# Patient Record
Sex: Male | Born: 1975 | Race: White | Hispanic: No | Marital: Married | Smoking: Never smoker
Health system: Southern US, Community
[De-identification: ages and names within clinical notes are randomized; demographics above are authoritative.]

---

## 2010-07-21 ENCOUNTER — Emergency Department: Payer: Self-pay | Admitting: Internal Medicine

## 2011-04-23 ENCOUNTER — Emergency Department: Payer: Self-pay | Admitting: Emergency Medicine

## 2011-04-23 LAB — CBC
MCH: 30.8 pg (ref 26.0–34.0)
MCHC: 34.2 g/dL (ref 32.0–36.0)
Platelet: 234 10*3/uL (ref 150–440)
RBC: 5.01 10*6/uL (ref 4.40–5.90)
RDW: 12.9 % (ref 11.5–14.5)

## 2011-04-23 LAB — COMPREHENSIVE METABOLIC PANEL
Alkaline Phosphatase: 45 U/L — ABNORMAL LOW (ref 50–136)
Anion Gap: 14 (ref 7–16)
BUN: 15 mg/dL (ref 7–18)
Calcium, Total: 8.9 mg/dL (ref 8.5–10.1)
Chloride: 108 mmol/L — ABNORMAL HIGH (ref 98–107)
Creatinine: 0.85 mg/dL (ref 0.60–1.30)
EGFR (African American): >60
EGFR (Non-African Amer.): >60
Glucose: 110 mg/dL — ABNORMAL HIGH (ref 65–99)
Osmolality: 288 (ref 275–301)
SGPT (ALT): 44 U/L

## 2011-04-23 LAB — TSH: Thyroid Stimulating Horm: 3.02 u[IU]/mL

## 2011-04-23 LAB — ETHANOL: Ethanol: 215 mg/dL

## 2011-06-07 ENCOUNTER — Emergency Department: Payer: Self-pay | Admitting: Internal Medicine

## 2011-06-07 LAB — COMPREHENSIVE METABOLIC PANEL
Anion Gap: 15 (ref 7–16)
Bilirubin,Total: 0.8 mg/dL (ref 0.2–1.0)
Calcium, Total: 9.4 mg/dL (ref 8.5–10.1)
Creatinine: 0.92 mg/dL (ref 0.60–1.30)
EGFR (African American): >60
EGFR (Non-African Amer.): >60
Glucose: 103 mg/dL — ABNORMAL HIGH (ref 65–99)
Osmolality: 269 (ref 275–301)
Potassium: 3.2 mmol/L — ABNORMAL LOW (ref 3.5–5.1)
SGOT(AST): 31 U/L (ref 15–37)
Sodium: 135 mmol/L — ABNORMAL LOW (ref 136–145)
Total Protein: 8.4 g/dL — ABNORMAL HIGH (ref 6.4–8.2)

## 2011-06-07 LAB — URINALYSIS, COMPLETE
Bilirubin,UR: NEGATIVE
Blood: NEGATIVE
Leukocyte Esterase: NEGATIVE
Ph: 6 (ref 4.5–8.0)
Protein: NEGATIVE
RBC,UR: 1 /HPF (ref 0–5)
Specific Gravity: 1.019 (ref 1.003–1.030)

## 2011-06-07 LAB — DRUG SCREEN, URINE
Benzodiazepine, Ur Scrn: NEGATIVE (ref ?–200)
Cocaine Metabolite,Ur ~~LOC~~: NEGATIVE (ref ?–300)
Methadone, Ur Screen: NEGATIVE (ref ?–300)
Opiate, Ur Screen: NEGATIVE (ref ?–300)
Phencyclidine (PCP) Ur S: NEGATIVE (ref ?–25)
Tricyclic, Ur Screen: NEGATIVE (ref ?–1000)

## 2011-06-07 LAB — ETHANOL: Ethanol: <3 mg/dL

## 2011-06-07 LAB — ACETAMINOPHEN LEVEL: Acetaminophen: <2 ug/mL

## 2011-06-14 ENCOUNTER — Emergency Department: Payer: Self-pay | Admitting: Emergency Medicine

## 2011-06-15 LAB — ETHANOL: Ethanol %: 0.12 % — ABNORMAL HIGH (ref 0.000–0.080)

## 2011-06-15 LAB — URINALYSIS, COMPLETE
Bilirubin,UR: NEGATIVE
Glucose,UR: NEGATIVE mg/dL (ref 0–75)
Leukocyte Esterase: NEGATIVE
RBC,UR: <1 /HPF (ref 0–5)
Specific Gravity: 1.024 (ref 1.003–1.030)
Squamous Epithelial: <1
WBC UR: <1 /HPF (ref 0–5)

## 2011-06-15 LAB — CBC
HGB: 16.9 g/dL (ref 13.0–18.0)
MCH: 32.4 pg (ref 26.0–34.0)
MCV: 93 fL (ref 80–100)
RBC: 5.21 10*6/uL (ref 4.40–5.90)
WBC: 12.9 10*3/uL — ABNORMAL HIGH (ref 3.8–10.6)

## 2011-06-15 LAB — COMPREHENSIVE METABOLIC PANEL
BUN: 15 mg/dL (ref 7–18)
Bilirubin,Total: 0.4 mg/dL (ref 0.2–1.0)
Co2: 14 mmol/L — ABNORMAL LOW (ref 21–32)
Creatinine: 1.16 mg/dL (ref 0.60–1.30)
EGFR (African American): >60
EGFR (Non-African Amer.): >60
Glucose: 147 mg/dL — ABNORMAL HIGH (ref 65–99)
Osmolality: 283 (ref 275–301)
SGPT (ALT): 49 U/L
Sodium: 140 mmol/L (ref 136–145)
Total Protein: 8.6 g/dL — ABNORMAL HIGH (ref 6.4–8.2)

## 2011-06-15 LAB — DRUG SCREEN, URINE
Amphetamines, Ur Screen: NEGATIVE (ref ?–1000)
Benzodiazepine, Ur Scrn: POSITIVE (ref ?–200)
Cannabinoid 50 Ng, Ur ~~LOC~~: NEGATIVE (ref ?–50)
Cocaine Metabolite,Ur ~~LOC~~: NEGATIVE (ref ?–300)
Opiate, Ur Screen: NEGATIVE (ref ?–300)
Phencyclidine (PCP) Ur S: NEGATIVE (ref ?–25)

## 2011-06-15 LAB — TROPONIN I: Troponin-I: <0.02 ng/mL

## 2012-04-25 ENCOUNTER — Emergency Department: Payer: Self-pay | Admitting: Emergency Medicine

## 2012-04-25 LAB — SALICYLATE LEVEL: Salicylates, Serum: <1.7 mg/dL

## 2012-04-25 LAB — CBC
HCT: 48.2 % (ref 40.0–52.0)
HGB: 16.5 g/dL (ref 13.0–18.0)
MCH: 33.3 pg (ref 26.0–34.0)
MCHC: 34.3 g/dL (ref 32.0–36.0)
MCV: 97 fL (ref 80–100)
Platelet: 134 10*3/uL — ABNORMAL LOW (ref 150–440)
RBC: 4.96 10*6/uL (ref 4.40–5.90)
WBC: 6.8 10*3/uL (ref 3.8–10.6)

## 2012-04-25 LAB — DRUG SCREEN, URINE
Amphetamines, Ur Screen: NEGATIVE (ref ?–1000)
Barbiturates, Ur Screen: NEGATIVE (ref ?–200)
Benzodiazepine, Ur Scrn: NEGATIVE (ref ?–200)
Cocaine Metabolite,Ur ~~LOC~~: NEGATIVE (ref ?–300)
MDMA (Ecstasy)Ur Screen: NEGATIVE (ref ?–500)
Methadone, Ur Screen: NEGATIVE (ref ?–300)
Opiate, Ur Screen: NEGATIVE (ref ?–300)
Phencyclidine (PCP) Ur S: NEGATIVE (ref ?–25)

## 2012-04-25 LAB — COMPREHENSIVE METABOLIC PANEL
Alkaline Phosphatase: 105 U/L (ref 50–136)
Anion Gap: 17 — ABNORMAL HIGH (ref 7–16)
BUN: 4 mg/dL — ABNORMAL LOW (ref 7–18)
Bilirubin,Total: 1 mg/dL (ref 0.2–1.0)
Calcium, Total: 9.2 mg/dL (ref 8.5–10.1)
Chloride: 92 mmol/L — ABNORMAL LOW (ref 98–107)
Co2: 26 mmol/L (ref 21–32)
Creatinine: 0.77 mg/dL (ref 0.60–1.30)
EGFR (Non-African Amer.): >60
Potassium: 2.5 mmol/L — CL (ref 3.5–5.1)
Sodium: 135 mmol/L — ABNORMAL LOW (ref 136–145)
Total Protein: 8.8 g/dL — ABNORMAL HIGH (ref 6.4–8.2)

## 2012-04-25 LAB — BASIC METABOLIC PANEL
Anion Gap: 19 — ABNORMAL HIGH (ref 7–16)
Co2: 24 mmol/L (ref 21–32)
EGFR (African American): >60
EGFR (Non-African Amer.): >60
Osmolality: 269 (ref 275–301)

## 2012-04-25 LAB — TSH: Thyroid Stimulating Horm: 2.11 u[IU]/mL

## 2012-04-25 LAB — ETHANOL: Ethanol %: 0.211 % — ABNORMAL HIGH (ref 0.000–0.080)

## 2012-04-26 LAB — ETHANOL: Ethanol: 5 mg/dL

## 2012-06-03 ENCOUNTER — Inpatient Hospital Stay: Payer: Self-pay | Admitting: Student

## 2012-06-03 LAB — COMPREHENSIVE METABOLIC PANEL
Alkaline Phosphatase: 121 U/L (ref 50–136)
Bilirubin,Total: 0.6 mg/dL (ref 0.2–1.0)
Creatinine: 0.88 mg/dL (ref 0.60–1.30)
EGFR (African American): >60
EGFR (Non-African Amer.): >60
Glucose: 102 mg/dL — ABNORMAL HIGH (ref 65–99)
Osmolality: 268 (ref 275–301)
Potassium: 3.7 mmol/L (ref 3.5–5.1)
SGOT(AST): 60 U/L — ABNORMAL HIGH (ref 15–37)
SGPT (ALT): 55 U/L (ref 12–78)
Sodium: 134 mmol/L — ABNORMAL LOW (ref 136–145)
Total Protein: 8.7 g/dL — ABNORMAL HIGH (ref 6.4–8.2)

## 2012-06-03 LAB — CBC
HCT: 46.8 % (ref 40.0–52.0)
MCH: 33.9 pg (ref 26.0–34.0)
MCV: 95 fL (ref 80–100)
RBC: 4.91 10*6/uL (ref 4.40–5.90)
RDW: 13.5 % (ref 11.5–14.5)

## 2012-06-03 LAB — DRUG SCREEN, URINE
Barbiturates, Ur Screen: NEGATIVE (ref ?–200)
Benzodiazepine, Ur Scrn: NEGATIVE (ref ?–200)
Cannabinoid 50 Ng, Ur ~~LOC~~: NEGATIVE (ref ?–50)
Cocaine Metabolite,Ur ~~LOC~~: NEGATIVE (ref ?–300)
MDMA (Ecstasy)Ur Screen: NEGATIVE (ref ?–500)
Opiate, Ur Screen: NEGATIVE (ref ?–300)

## 2012-06-03 LAB — PHOSPHORUS: Phosphorus: 4.5 mg/dL (ref 2.5–4.9)

## 2012-06-03 LAB — URINALYSIS, COMPLETE
Glucose,UR: NEGATIVE mg/dL (ref 0–75)
Nitrite: NEGATIVE
Ph: 5 (ref 4.5–8.0)
Protein: 100
RBC,UR: <1 /HPF (ref 0–5)
Specific Gravity: 1.02 (ref 1.003–1.030)
WBC UR: 7 /HPF (ref 0–5)

## 2012-06-04 LAB — CBC WITH DIFFERENTIAL/PLATELET
Basophil #: 0 10*3/uL (ref 0.0–0.1)
Basophil %: 0.4 %
Eosinophil #: 0 10*3/uL (ref 0.0–0.7)
Eosinophil %: 0.2 %
HCT: 38.3 % — ABNORMAL LOW (ref 40.0–52.0)
HGB: 13.7 g/dL (ref 13.0–18.0)
Lymphocyte #: 1.5 10*3/uL (ref 1.0–3.6)
Lymphocyte %: 23.6 %
MCH: 34 pg (ref 26.0–34.0)
MCHC: 35.9 g/dL (ref 32.0–36.0)
MCV: 95 fL (ref 80–100)
Monocyte #: 0.6 x10 3/mm (ref 0.2–1.0)
Platelet: 135 10*3/uL — ABNORMAL LOW (ref 150–440)
RBC: 4.04 10*6/uL — ABNORMAL LOW (ref 4.40–5.90)

## 2012-06-04 LAB — PHOSPHORUS: Phosphorus: 1.4 mg/dL — ABNORMAL LOW (ref 2.5–4.9)

## 2012-06-04 LAB — BASIC METABOLIC PANEL
Anion Gap: 8 (ref 7–16)
Calcium, Total: 8.3 mg/dL — ABNORMAL LOW (ref 8.5–10.1)
Chloride: 98 mmol/L (ref 98–107)
Co2: 27 mmol/L (ref 21–32)
Creatinine: 0.59 mg/dL — ABNORMAL LOW (ref 0.60–1.30)
EGFR (Non-African Amer.): >60
Glucose: 92 mg/dL (ref 65–99)

## 2012-06-04 LAB — MAGNESIUM: Magnesium: 1.6 mg/dL — ABNORMAL LOW

## 2012-06-05 LAB — BASIC METABOLIC PANEL
BUN: 2 mg/dL — ABNORMAL LOW (ref 7–18)
Co2: 25 mmol/L (ref 21–32)
EGFR (Non-African Amer.): >60
Potassium: 4.2 mmol/L (ref 3.5–5.1)
Sodium: 133 mmol/L — ABNORMAL LOW (ref 136–145)

## 2012-06-05 LAB — PHOSPHORUS: Phosphorus: 1.9 mg/dL — ABNORMAL LOW (ref 2.5–4.9)

## 2014-05-27 NOTE — Discharge Summary (Signed)
PATIENT NAME:  Jeffrey HumbleHARP, Jeffrey A MR#:  [id number] DATE OF BIRTH:  02-01-1976  DATE OF ADMISSION:  06/03/2012 DATE OF DISCHARGE:  06/05/2012  CONSULTANTS:  Dr. Toni Amendlapacs from psychiatry.   CHIEF COMPLAINT:  "I am here for detox."   DISCHARGE DIAGNOSES: 1.  Alcoholic ketosis.  2.  Alcohol withdrawal.  3.  Hypophosphatemia.  4.  Hypomagnesemia.  5.  Hypokalemia.  6.  Mild hyponatremia.  7.  History of depression.  8.  History of tobacco abuse.   DISCHARGE MEDICATIONS:  1.  Neutra-Phos one packet 3 times a day with meals for 3 days.  2.  Librium 20 mg every 8 hours for one day, then 15 mg every 12 hours for 1 day, then 10 mg for 1 day on day 3, then stop.   DIET:  Regular.   ACTIVITY:  As tolerated.   FOLLOW-UP:  Please follow with PCP [facility] for electrolyte checks in 1 to 2 weeks.  Stop drinking as discussed.   CODE STATUS:  The patient is a FULL CODE.   SIGNIFICANT LABORATORY AND IMAGING STUDIES:  Initial creatinine 0.88, sodium 134.  Initial potassium 3.7, lowest potassium was 3.3.  Initially, serum CO2 was 14 with anion gap of 24, phosphate was 1.4 with hydration on May 1 and magnesium was 1.6 with hydration on May 1.  LFTs with AST 60, on arrival total protein 8.7, otherwise within normal limits.  TSH was 0.908.  U-tox was negative.  Initial WBC 8.2, hemoglobin 16.6, platelets 188.  Urinalysis, 2+ blood, no nitrites or leukocyte esterase, 7 WBCs, no bacteria.   HISTORY OF PRESENT ILLNESS AND HOSPITAL COURSE:  For full details of history and physical, please see the dictation on April 30th by Dr. Randol KernElgergawy, but briefly this is a pleasant 39 year old male who has significant alcohol abuse, came in for detox, wanted to be admitted inpatient.  He had been drinking for many years, average 1/5 to 1/2 gallon of vodka on a daily basis.  He was tachycardic with a heart rate of 130s with anion gap of 24 and therefore was admitted to the hospitalist service and psychiatry was consulted.  He  was started on a banana bag and CIWA protocol.  He underwent minor withdrawals including some hallucinations and fine tremors, however has not had a significant hemodynamic instability.  He was dehydrated on arrival and was started on fluids as well as a banana bag.  U-tox did not suggest other substances.  He was seen by Dr. Toni Amendlapacs.  He did not want to be admitted to inpatient psych and wanted to stay on the medicine service.  His electrolytes were down and repleted as needed.  He was counseled about tobacco as well as alcohol abuse by me as well.  At this point, he does not appear to be in acute withdrawal, is calm and vital signs being stable with anion gap resolved.  Will be discharged with outpatient follow-up and an electrolyte check.  He will be discharged on a short taper of Librium as well as phosphate replacement.     TOTAL TIME SPENT:  35 minutes.    ____________________________ Krystal EatonShayiq Toney Difatta, MD sa:ea D: 06/05/2012 16:19:46 ET T: 06/06/2012 06:18:53 ET JOB#: [id number]  cc: Krystal EatonShayiq Mistey Hoffert, MD, <Dictator> Krystal EatonSHAYIQ Abraham Margulies MD ELECTRONICALLY SIGNED 06/29/2012 15:10

## 2014-05-27 NOTE — H&P (Signed)
PATIENT NAME:  Jeffrey Mosley, Jeffrey Mosley MR#:  [id number] DATE OF BIRTH:  Oct 13, 1975  DATE OF ADMISSION:  06/03/2012  REFERRING PHYSICIAN: Alcide Clever, MD   PRIMARY CARE PHYSICIAN: None.   CHIEF COMPLAINT: "I'm here for detox."   HISTORY OF PRESENT ILLNESS: This is a 39 year old male with significant past medical history of alcohol abuse who presents basically for detox. The patient reports he has history of heavy alcohol abuse for so many years, average one-fifth to one-half gallon of vodka on a daily basis. Reports he had one-half gallon of vodka yesterday and he was  not feeling well, so he came today for detox. The patient was found to be tachycardic with a heart rate in the 130s. Had basic blood work done where he was found to be in alcolholic ketoacidosis anion gap of 24. The patient denies any fever, chills, chest pain, shortness of breath, abdominal pain, nausea, vomiting, diarrhea, abdominal pain, coffee-ground emesis or bright red blood per rectum. He reports he has not been having good p.o. intake for the last couple of days. Hospitalist Service was requested to admit the patient for further management and treatment for his alcoholic ketoacidosis.   PAST MEDICAL HISTORY: Alcohol abuse.   PAST SURGICAL HISTORY: Right knee surgery.   ALLERGIES: No known drug allergies.   HOME MEDICATIONS: None.   FAMILY HISTORY: Significant for hypertension in his father.   SOCIAL HISTORY: The patient is unemployed. He smokes one-half to one pack per day. He drinks one-fifth to one-half gallon of vodka on a daily basis. Denies any illicit drug use.   REVIEW OF SYSTEMS:  CONSTITUTIONAL: Denies fever, chills. Complains of fatigue, generalized weakness.  EYES: Denies blurry vision, double vision, pain or inflammation.  ENT: Denies tinnitus, ear pain, hearing loss.  RESPIRATORY: Denies cough, wheezing, hemoptysis.  CARDIOVASCULAR: Denies chest pain, edema, arrhythmia, palpitations.  GASTROINTESTINAL: Denies  nausea, vomiting, diarrhea, abdominal pain, hematemesis. Has poor p.o. intake.  GENITOURINARY: Denies dysuria, hematuria, renal colic.  ENDOCRINE: Denies polyuria, polydipsia, heat or cold intolerance.  HEMATOLOGY: Denies anemia, easy bruising, bleeding diathesis.  INTEGUMENTARY: Denies acne, rash or skin lesions.  MUSCULOSKELETAL: Denies gout, arthritis, cramps, limited activity.  NEUROLOGIC: Denies numbness, weakness, CVA, TIA, seizures, memory loss. Has history of alcohol abuse. Denies substance abuse. No anxiety, insomnia or schizophrenia.   PHYSICAL EXAMINATION:  VITAL SIGNS: Temperature 98.1, pulse 115, respiratory rate 20, blood pressure 136/79, saturating 96% on room air.  GENERAL: Poorly kempt male, disheveled, laying in bed in no apparent distress.  HEENT: Head is atraumatic, normocephalic. Pupils equal, reactive to light. Pink conjunctivae. Anicteric sclerae. Moist oral mucosa.  NECK: Supple. No thyromegaly. No JVD.  CHEST: Good air entry bilaterally. No wheezing, rales, rhonchi.  CARDIOVASCULAR: S1, S2 heard. No rubs, murmurs, or gallops. ABDOMEN: Soft, nontender, nondistended. Bowel sounds present.  EXTREMITIES: No edema. No clubbing. No cyanosis.  PSYCHIATRIC: Appropriate affect. Awake, alert x 3. Intact judgment and insight.  NEUROLOGICAL: Grossly intact. Motor is 5/5. No tremors  currently being noticed. SKIN: Normal skin turgor. Warm and dry.   PERTINENT LABS: Glucose 102, BUN 12, creatinine 0.88, sodium 134, potassium 3.7, chloride 96, CO2 is 14, total protein 8.7, ALT 55, AST 60, alkaline phosphatase 121. TSH 0.908. White blood cell 8.2, hemoglobin 16.6, hematocrit 46.8, platelets 188.   ASSESSMENT AND PLAN:  1. Ketoacidosis. The patient is having alcoholic ketoacidosis with possible fasting ketoacidosis as well, as he has been having heavy drinking with binge drinking with decreased p.o. intake. The patient will  be started on banana bag, will check his magnesium level,  will check his phosphorous level and replace as needed, will be kept on aggressive hydration and will monitor his electrolytes closely and replete as needed. Will give him 40 of p.o. potassium as his potassium level is low normal, will give him one dose of IV thiamine 100 mg IV prior to any glucose, given the patient has been eating good, had finished his breakfast in ED without any problem with nausea or vomiting.  2. Alcohol abuse. Will consult Psych for detox. The patient's last drink was yesterday, so we will start him on CIWA protocol to prevent any withdrawals.  3. Tobacco abuse. Counseled. At this point does not want nicotine patch.  4. Deep vein thrombosis prophylaxis. Subcu heparin.  5. CODE STATUS: Full code.   Total time spent on admission and patient care: 45 minutes.    ____________________________ Jeffrey Armsawood S. Braley Luckenbaugh, MD dse:es D: 06/03/2012 07:38:51 ET T: 06/03/2012 09:07:27 ET JOB#: [id number]  cc: Jeffrey Armsawood S. Demerius Podolak, MD, <Dictator> Jeffrey Wojnar Teena IraniS Citlalli Weikel MD ELECTRONICALLY SIGNED 06/05/2012 22:19

## 2014-05-27 NOTE — Consult Note (Signed)
Psychiatry: This is a consult for this 39 year old man who is in the hospital for alcohol detox. The consultation is for assistance with alcohol detox. obtained from the patient and the chart. Patient tells me that he came to the hospital seeking detox from alcohol because he was afraid it would be unsafe to do it on his own and he felt that the hospital would be the safest option. He indicates that he is motivated because he does not want to further damage the relationship with his young children and also has been unable to work recently. He has had experience with staying sober in the past and feels confident he can do it again if he can get detoxed. Patient states that he has been drinking on an intermittent basis where he will binge for several days in a row and then go for 2-3 days without any alcohol before starting again. He's been doing this for what sounds like probably most of the year. On days when he is binging he can drink up to a fifth of liquor. Patient denies that he is abusing any other drugs. He states that his mood is a little nervous but not terribly depressed. He does not report feeling hopeless. He does not report any suicidal ideation. describes a history of alcohol dependence going back many years. He has had experience with staying sober for extended periods of time by participating in substance abuse residential treatment programs. He appears to have a good understanding of what those sorts of programs consist of and what sort of things he needs to do to stay sober. He states that otherwise he has only been treated with antidepressants on one occasion when he was going through a divorce. He thinks it was probably fluoxetine. He says a primary care doctor gave it to him and he took it for a month but didn't think it made any difference so he threw it away. Does not describe any episodes of major depression or anxiety disorders outside the context of intoxication or withdrawal. the patient  appears to generally be in pretty good shape. He does not know of any significant other ongoing medical problems. he is separated from his wife and I believe he indicates they may have divorced. He has 2 young daughters and it is important to him to maintain his relationship with them. 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 He tells me that he has "burned some bridges" as far as work and that he regrets that. was interviewed in his hospital bed. He was awake alert and cooperative. He was a little bit passive but not completely resistant to conversation. Eye contact intermittent. Psychomotor activity sluggish but as to be expected. Speech quiet and decreased in total amount but easy to understand. Affect showed appropriate tone. He was able to make appropriate jokes at times. There was no tearfulness. Mood was stated as being okay. Thoughts were lucid without any evidence of bizarre or delusional thinking. He tells me that he has had some visual hallucinations today. These have consisted of seeing things crawling on the wall. He has good insight that this is a symptom of his alcohol withdrawal. Totally denies suicidal or homicidal ideation. He is alert and oriented x4. I did not do more formal cognitive testing but he appears to be of normal intelligence. Patient with alcohol dependence currently undergoing voluntary detox. He does not have a history of delirium tremens or seizures in the  past but also had never had a history of visual hallucinations in those are happening this time. He is aware that this could be a bad sign that his alcohol problems are progressing neurologically. He is currently cooperative with detox. He is able to articulate an appropriate set of options for treatment after discharge. He is familiar with several long-term rehabilitation programs and has already made contact with them.does not appear to have any other clear  psychiatric problem other than his alcohol dependence. He was admitted to the medical service because of an unusually high anion gap and abnormalities in his presenting chemistry profile. suggested to the patient that we could transfer him to psychiatry with the advantage of having some substance abuse oriented programming and also having social work or care management familiar with substance abuse treatment options. The patient states that he would not like to do that. He is familiar with what such a ward Is like and says he does not want to be around all the noise. He makes a good point that he already is taking care of his own outpatient plan. psychoeducation done regarding alcohol detox. Some supportive and encouragement given. I don't have any suggestions about changing the medical treatment. The current detox protocol appears to be adequate. I will try to check up with the patient over the next day or so to see if I can be of any further assistance. At this time we are staying quite full on the psychiatry ward and it may not be practical to consider transfer in any case although the patient's opposition to it would rule it out as he does not meet commitment criteria.I: Alcohol dependence II: No diagnosisIII: Alcohol withdrawalIV: Severe from difficulty with work and family as well as alcohol dependenceV: 40spent on consult 50 minutes    Electronic Signatures: Atticus Wedin, Jackquline DenmarkJohn T (MD)  (Signed on 30-Apr-14 22:53)  Authored  Last Updated: 30-Apr-14 22:53 by Audery Amellapacs, Tejon Gracie T (MD)

## 2018-03-11 ENCOUNTER — Emergency Department (HOSPITAL_COMMUNITY): Payer: BLUE CROSS/BLUE SHIELD

## 2018-03-11 ENCOUNTER — Emergency Department (HOSPITAL_COMMUNITY)
Admission: EM | Admit: 2018-03-11 | Discharge: 2018-03-11 | Disposition: A | Discharge status: Home / Self Care | Payer: BLUE CROSS/BLUE SHIELD | Attending: Emergency Medicine | Admitting: Emergency Medicine

## 2018-03-11 ENCOUNTER — Encounter (HOSPITAL_COMMUNITY): Payer: Self-pay | Admitting: Emergency Medicine

## 2018-03-11 ENCOUNTER — Other Ambulatory Visit: Payer: Self-pay

## 2018-03-11 DIAGNOSIS — G40909 Epilepsy, unspecified, not intractable, without status epilepticus: Secondary | ICD-10-CM | POA: Diagnosis present

## 2018-03-11 DIAGNOSIS — F10920 Alcohol use, unspecified with intoxication, uncomplicated: Secondary | ICD-10-CM

## 2018-03-11 DIAGNOSIS — F10929 Alcohol use, unspecified with intoxication, unspecified: Secondary | ICD-10-CM | POA: Diagnosis not present

## 2018-03-11 LAB — COMPREHENSIVE METABOLIC PANEL
ALT: 20 U/L (ref 0–44)
AST: 19 U/L (ref 15–41)
Albumin: 4.6 g/dL (ref 3.5–5.0)
Alkaline Phosphatase: 48 U/L (ref 38–126)
Anion gap: 19 — ABNORMAL HIGH (ref 5–15)
BILIRUBIN TOTAL: 0.5 mg/dL (ref 0.3–1.2)
BUN: 5 mg/dL — AB (ref 6–20)
CALCIUM: 9.1 mg/dL (ref 8.9–10.3)
CO2: 23 mmol/L (ref 22–32)
CREATININE: 0.91 mg/dL (ref 0.61–1.24)
Chloride: 99 mmol/L (ref 98–111)
GFR calc non Af Amer: >60 mL/min (ref >60)
Glucose, Bld: 137 mg/dL — ABNORMAL HIGH (ref 70–99)
Potassium: 4.1 mmol/L (ref 3.5–5.1)
Sodium: 141 mmol/L (ref 135–145)
TOTAL PROTEIN: 7.8 g/dL (ref 6.5–8.1)

## 2018-03-11 LAB — CBC
HCT: 46.5 % (ref 39.0–52.0)
Hemoglobin: 15.1 g/dL (ref 13.0–17.0)
MCH: 29.3 pg (ref 26.0–34.0)
MCHC: 32.5 g/dL (ref 30.0–36.0)
MCV: 90.3 fL (ref 80.0–100.0)
Platelets: 298 10*3/uL (ref 150–400)
RBC: 5.15 MIL/uL (ref 4.22–5.81)
RDW: 11.9 % (ref 11.5–15.5)
WBC: 7.5 10*3/uL (ref 4.0–10.5)
nRBC: 0 % (ref 0.0–0.2)

## 2018-03-11 LAB — RAPID URINE DRUG SCREEN, HOSP PERFORMED
Amphetamines: NOT DETECTED
Barbiturates: NOT DETECTED
Benzodiazepines: NOT DETECTED
Cocaine: NOT DETECTED
Opiates: NOT DETECTED
Tetrahydrocannabinol: NOT DETECTED

## 2018-03-11 LAB — URINALYSIS, ROUTINE W REFLEX MICROSCOPIC
BILIRUBIN URINE: NEGATIVE
Bacteria, UA: NONE SEEN
Glucose, UA: NEGATIVE mg/dL
Ketones, ur: NEGATIVE mg/dL
LEUKOCYTES UA: NEGATIVE
Nitrite: NEGATIVE
Protein, ur: 30 mg/dL — AB
Specific Gravity, Urine: 1.011 (ref 1.005–1.030)
pH: 6 (ref 5.0–8.0)

## 2018-03-11 LAB — ETHANOL: Alcohol, Ethyl (B): 321 mg/dL (ref <10)

## 2018-03-11 MED ORDER — LORAZEPAM 2 MG/ML IJ SOLN
2.0000 mg | Freq: Once | INTRAMUSCULAR | Status: AC
  Administered 2018-03-11: 2 mg via INTRAVENOUS
  Filled 2018-03-11: qty 1

## 2018-03-11 MED ORDER — LORAZEPAM 1 MG PO TABS: 0.0000 mg | ORAL_TABLET | Freq: Two times a day (BID) | ORAL | Status: DC

## 2018-03-11 MED ORDER — SODIUM CHLORIDE 0.9 % IV BOLUS
1000.0000 mL | Freq: Once | INTRAVENOUS | Status: AC
  Administered 2018-03-11: 1000 mL via INTRAVENOUS

## 2018-03-11 MED ORDER — LORAZEPAM 2 MG/ML IJ SOLN: 0.0000 mg | Freq: Two times a day (BID) | INTRAMUSCULAR | Status: DC

## 2018-03-11 MED ORDER — THIAMINE HCL 100 MG/ML IJ SOLN: 100.0000 mg | Freq: Every day | INTRAMUSCULAR | Status: DC

## 2018-03-11 MED ORDER — VITAMIN B-1 100 MG PO TABS
100.0000 mg | ORAL_TABLET | Freq: Every day | ORAL | Status: DC
  Administered 2018-03-11: 100 mg via ORAL

## 2018-03-11 MED ORDER — LORAZEPAM 1 MG PO TABS: 0.0000 mg | ORAL_TABLET | Freq: Four times a day (QID) | ORAL | Status: DC

## 2018-03-11 MED ORDER — CHLORDIAZEPOXIDE HCL 25 MG PO CAPS
50.0000 mg | ORAL_CAPSULE | Freq: Once | ORAL | Status: AC
  Administered 2018-03-11: 50 mg via ORAL
  Filled 2018-03-11: qty 2

## 2018-03-11 MED ORDER — VITAMIN B-1 100 MG PO TABS
100.0000 mg | ORAL_TABLET | Freq: Every day | ORAL | Status: DC
  Filled 2018-03-11: qty 1

## 2018-03-11 MED ORDER — LORAZEPAM 2 MG/ML IJ SOLN: 0.0000 mg | Freq: Four times a day (QID) | INTRAMUSCULAR | Status: DC

## 2018-03-11 MED ORDER — LORAZEPAM 2 MG/ML IJ SOLN
1.0000 mg | Freq: Once | INTRAMUSCULAR | Status: AC
  Administered 2018-03-11: 1 mg via INTRAVENOUS
  Filled 2018-03-11: qty 1

## 2018-03-11 MED ORDER — FOLIC ACID 1 MG PO TABS
1.0000 mg | ORAL_TABLET | Freq: Once | ORAL | Status: AC
  Administered 2018-03-11: 1 mg via ORAL
  Filled 2018-03-11: qty 1

## 2018-03-11 NOTE — ED Notes (Addendum)
Pt declining rectal temperature and will not let staff help him get cleaned up.

## 2018-03-11 NOTE — ED Notes (Signed)
Pt ambulatory to bathroom

## 2018-03-11 NOTE — ED Notes (Signed)
Pt found wondering in hallway again. Redirected to bed, informed pt that he is waiting on social work to help him. Pt sts "I don't want to talk to social work, I want to talk to a doctor right now." Dr. Lockie Mola made aware

## 2018-03-11 NOTE — ED Notes (Signed)
Pt given turkey sandwich

## 2018-03-11 NOTE — ED Notes (Signed)
Pt calling out frequently to staff stating, "I am having a seizure".  Pt alert and talking the entire time and does not appear to be having any seizure activity.

## 2018-03-11 NOTE — ED Notes (Addendum)
Patient transported to x-ray. ?

## 2018-03-11 NOTE — ED Notes (Addendum)
Ambulated pt. In hallway, pt. Pt. Was able to ambulate with very little assistance the second time. He was able to answer MD questions while not tremor-ing.  Pt. Is understanding of things discussed, and getting help and resources to aid in recovery.

## 2018-03-11 NOTE — ED Provider Notes (Signed)
I assumed care of this patient from Dr. Criss Alvine at 3 pm.  Please see their note for further details of Hx, PE.  Briefly patient is a 43 y.o. male who presented with ETOH intoxication, initially concern for possible seizure but ETOH 321, labs and imaging unremarkable, patient awaiting MTF.   Current plan is to MTF and re-eval.  Patient was allowed to metabolize for several hours in the ED.  He intermittently would get agitated and was given Ativan with improvement.  I had an extensive conversation with the patient about giving him resources for rehab.  Patient had an elevated alcohol level today.  I believe that there is a element of secondary gain.  Patient appears to be exhibiting signs of alcohol withdrawal by having what appears to be intermittent hallucinations but then all of a sudden has a normal conversation with me.  He is able to ambulate without any issues.  Patient eventually admitted to homeless and having nowhere to go.  He states that he has a brother somewhere in town but does not have his contact information.  I did contact social work but we do not have any peer support counseling available tonight.  Patient states that he has been to rehab before and has a number for rehab.  Patient I believe was trying to exhibit signs of withdrawal to seek admission.  Patient overall had unremarkable work-up.  CT scan of his head was normal.  After prolonged hospital stay patient continues to deny any suicidal homicidal ideation.  He was able to eat and drink without any issues.  He was able to ambulate without any issues.  Patient is clinically sober.  He does not want to stay for any social work resources.  I initially was to offer the patient a Librium prescription however I do not believe that he is motivated at this time to go to rehab as he is unwilling to stay to get resources from social work for help with his ETOH addiciton.  Therefore patient was discharged from the ED.  This chart was dictated  using voice recognition software.  Despite best efforts to proofread,  errors can occur which can change the documentation meaning.       Virgina Norfolk, DO 03/11/18 2003

## 2018-03-11 NOTE — Progress Notes (Addendum)
Consult request has been received. [facility] ED CSW covering for [facility] ED, attempting to follow up at present time.  CSW spoke to [facility] who states pt is being considered for admittance at this time.  CSW will continue to follow for D/C needs.  Dorothe Pea. Bedford Winsor, LCSW, LCAS, CSI Clinical Social Worker Ph: [phone]

## 2018-03-11 NOTE — ED Notes (Signed)
Per EDP assessment, CIWA 17 and 2 mg IV ativan ordered and administered.

## 2018-03-11 NOTE — ED Notes (Signed)
Got patient undress on the monitor did ekg shown to Dr Criss Alvine

## 2018-03-11 NOTE — ED Provider Notes (Signed)
[facility] EMERGENCY DEPARTMENT Provider Note   CSN: [id number] Arrival date & time: 03/11/18  1149  LEVEL 5 CAVEAT - INTOXICATION   History   Chief Complaint Chief Complaint  Patient presents with  . Alcohol Intoxication    HPI Jeffrey Mosley. is a 43 y.o. male.  HPI  43 year old male presents via EMS with shaking.  The patient states that he feels like he is about to have a seizure.  He has had seizures before related to alcohol.  He drinks about 1/5 of liquor per day and states he has had this much this morning.  He was found at [employer] and acting strange and shaking and so EMS was called.  They found him sitting on a bench and he had had a bowel movement in his pants.  He is been having intermittent shaking while being awake/tremors.  The patient endorses a headache since today but cannot tell me exactly when it started.  He states he has thrown up.  History is otherwise fairly limited given his acute intoxication.  History reviewed. No pertinent past medical history.  There are no active problems to display for this patient.   History reviewed. No pertinent surgical history.      Home Medications    Prior to Admission medications   Not on File    Family History History reviewed. No pertinent family history.  Social History Social History   Tobacco Use  . Smoking status: Never Smoker  . Smokeless tobacco: Never Used  Substance Use Topics  . Alcohol use: Yes  . Drug use: Not Currently     Allergies   Patient has no allergy information on record.   Review of Systems Review of Systems  Unable to perform ROS: Mental status change     Physical Exam Updated Vital Signs BP (!) 108/57   Pulse (!) 106   Temp 99.1 F (37.3 C) (Oral)   Resp 20   SpO2 94%   Physical Exam Vitals signs and nursing note reviewed.  Constitutional:      Appearance: He is well-developed.  HENT:     Head: Normocephalic and atraumatic.     Right  Ear: External ear normal.     Left Ear: External ear normal.     Nose: Nose normal.  Eyes:     General:        Right eye: No discharge.        Left eye: No discharge.     Extraocular Movements: Extraocular movements intact.     Pupils: Pupils are equal, round, and reactive to light.  Neck:     Musculoskeletal: Neck supple.  Cardiovascular:     Rate and Rhythm: Regular rhythm. Tachycardia present.     Heart sounds: Normal heart sounds.  Pulmonary:     Effort: Pulmonary effort is normal.     Breath sounds: Normal breath sounds.  Abdominal:     General: There is no distension.     Palpations: Abdomen is soft.     Tenderness: There is no abdominal tenderness.  Skin:    General: Skin is warm and dry.  Neurological:     Mental Status: He is alert.     Comments: Awake, alert, oriented to person and place. Knows it's 2020 but guessed January for month and doesn't know day of week. CN 3-12 grossly intact. 5/5 strength in all 4 extremities. Grossly normal sensation. Normal finger to nose.  He has diffuse shaking to  bilateral upper extremities, head, and is stuttering. Occasionally he will stop, and if I do a focused exam requiring his participation it completely stops.  Psychiatric:        Mood and Affect: Mood is not anxious.      ED Treatments / Results  Labs (all labs ordered are listed, but only abnormal results are displayed) Labs Reviewed  COMPREHENSIVE METABOLIC PANEL - Abnormal; Notable for the following components:      Result Value   Glucose, Bld 137 (*)    BUN 5 (*)    Anion gap 19 (*)    All other components within normal limits  ETHANOL - Abnormal; Notable for the following components:   Alcohol, Ethyl (B) 321 (*)    All other components within normal limits  URINALYSIS, ROUTINE W REFLEX MICROSCOPIC - Abnormal; Notable for the following components:   Hgb urine dipstick SMALL (*)    Protein, ur 30 (*)    All other components within normal limits  CBC  RAPID URINE  DRUG SCREEN, HOSP PERFORMED  CBG MONITORING, ED    EKG EKG Interpretation  Date/Time:  Wednesday March 11 2018 11:57:23 EST Ventricular Rate:  107 PR Interval:    QRS Duration: 97 QT Interval:  350 QTC Calculation: 467 R Axis:   -47 Text Interpretation:  Sinus tachycardia Inferior infarct, old Consider anterior infarct Baseline wander in lead(s) II aVF V3 Interpretation limited secondary to artifact Confirmed by Pricilla LovelessGoldston, Mattilyn Crites [phone]([id number]) on 03/11/2018 12:13:26 PM   Radiology Ct Head Wo Contrast  Result Date: 03/11/2018 CLINICAL DATA:  Tremors and altered mental status today. EXAM: CT HEAD WITHOUT CONTRAST TECHNIQUE: Contiguous axial images were obtained from the base of the skull through the vertex without intravenous contrast. COMPARISON:  None. FINDINGS: Brain: No evidence of acute infarction, hemorrhage, hydrocephalus, extra-axial collection or mass lesion/mass effect. Vascular: No hyperdense vessel or unexpected calcification. Skull: Intact.  No focal lesion. Sinuses/Orbits: Mild mucosal thickening left maxillary sinus noted. Other: None. IMPRESSION: No acute abnormality. Mild mucosal thickening left maxillary sinus. Electronically Signed   By: Drusilla Kannerhomas  Dalessio M.D.   On: 03/11/2018 13:15   Dg Chest Portable 1 View  Result Date: 03/11/2018 CLINICAL DATA:  Altered mental status. EXAM: PORTABLE CHEST 1 VIEW COMPARISON:  None. FINDINGS: The heart size and mediastinal contours are within normal limits. Both lungs are clear. No acute bone abnormality. Old posttraumatic changes of the distal right clavicle. IMPRESSION: No acute or significant abnormalities. Electronically Signed   By: Francene BoyersJames  Maxwell M.D.   On: 03/11/2018 12:38    Procedures Procedures (including critical care time)  Medications Ordered in ED Medications  sodium chloride 0.9 % bolus 1,000 mL (0 mLs Intravenous Stopped 03/11/18 1410)  LORazepam (ATIVAN) injection 1 mg (1 mg Intravenous Given 03/11/18 1236)  LORazepam (ATIVAN)  injection 2 mg (2 mg Intravenous Given 03/11/18 1426)     Initial Impression / Assessment and Plan / ED Course  I have reviewed the triage vital signs and the nursing notes.  Pertinent labs & imaging results that were available during my care of the patient were reviewed by me and considered in my medical decision making (see chart for details).     Patient presents with alcohol intoxication.  He states he feels like he is going to have a seizure but no seizure-like activity has been seen.  Given his report of headache and his chronic alcohol abuse, CT head was obtained but is negative.  He was given a small bolus of  Ativan for this shaking but given the bilateral shaking, head shaking, and him being awake, alert and talking to me, I highly doubt this is an acute seizure.  It seems to stop when he is distracted.  As he is starting to sober he seems to becoming a little more agitated and was given a second dose of Ativan.  He will need to continue to metabolize and be reassessed.  Otherwise, screening lab work is overall benign. Care transferred to Dr. Lockie Mola.  Final Clinical Impressions(s) / ED Diagnoses   Final diagnoses:  Alcoholic intoxication without complication [facility])    ED Discharge Orders    None       Pricilla Loveless, MD 03/11/18 [phone]

## 2018-03-11 NOTE — ED Triage Notes (Signed)
Pt here via [facility], pt was outside a grocery store and bystanders were concerned by him acting "odd". Pt was having tremors while sitting on a bench.  Pt reports heavier than normal drinking over the last 3 days.  Redness noted to occipital region.  Pt  had episode of incontinence of bowel. A&O x4.

## 2018-03-11 NOTE — ED Notes (Signed)
Pt. Refusing to stay in bed, redirecting pt multiple times back to bed.

## 2018-03-11 NOTE — ED Notes (Signed)
Tremors present in hands and face.

## 2018-03-12 ENCOUNTER — Inpatient Hospital Stay (HOSPITAL_COMMUNITY)
Admission: EM | Admit: 2018-03-12 | Discharge: 2018-03-16 | DRG: 896 | Disposition: A | Discharge status: Home / Self Care | Payer: BLUE CROSS/BLUE SHIELD | Attending: Internal Medicine | Admitting: Internal Medicine

## 2018-03-12 ENCOUNTER — Emergency Department (HOSPITAL_COMMUNITY): Payer: BLUE CROSS/BLUE SHIELD

## 2018-03-12 ENCOUNTER — Encounter (HOSPITAL_COMMUNITY): Payer: Self-pay

## 2018-03-12 DIAGNOSIS — E872 Acidosis, unspecified: Secondary | ICD-10-CM | POA: Diagnosis present

## 2018-03-12 DIAGNOSIS — S50311A Abrasion of right elbow, initial encounter: Secondary | ICD-10-CM | POA: Diagnosis present

## 2018-03-12 DIAGNOSIS — R402142 Coma scale, eyes open, spontaneous, at arrival to emergency department: Secondary | ICD-10-CM | POA: Diagnosis present

## 2018-03-12 DIAGNOSIS — S80212A Abrasion, left knee, initial encounter: Secondary | ICD-10-CM | POA: Diagnosis present

## 2018-03-12 DIAGNOSIS — W1830XA Fall on same level, unspecified, initial encounter: Secondary | ICD-10-CM | POA: Diagnosis present

## 2018-03-12 DIAGNOSIS — S0990XA Unspecified injury of head, initial encounter: Secondary | ICD-10-CM | POA: Diagnosis not present

## 2018-03-12 DIAGNOSIS — S60812A Abrasion of left wrist, initial encounter: Secondary | ICD-10-CM | POA: Diagnosis present

## 2018-03-12 DIAGNOSIS — Z23 Encounter for immunization: Secondary | ICD-10-CM | POA: Diagnosis not present

## 2018-03-12 DIAGNOSIS — E860 Dehydration: Secondary | ICD-10-CM | POA: Diagnosis present

## 2018-03-12 DIAGNOSIS — F10230 Alcohol dependence with withdrawal, uncomplicated: Secondary | ICD-10-CM | POA: Diagnosis not present

## 2018-03-12 DIAGNOSIS — R402252 Coma scale, best verbal response, oriented, at arrival to emergency department: Secondary | ICD-10-CM | POA: Diagnosis present

## 2018-03-12 DIAGNOSIS — S70212A Abrasion, left hip, initial encounter: Secondary | ICD-10-CM | POA: Diagnosis present

## 2018-03-12 DIAGNOSIS — F10129 Alcohol abuse with intoxication, unspecified: Secondary | ICD-10-CM | POA: Diagnosis not present

## 2018-03-12 DIAGNOSIS — S30811A Abrasion of abdominal wall, initial encounter: Secondary | ICD-10-CM | POA: Diagnosis present

## 2018-03-12 DIAGNOSIS — W19XXXA Unspecified fall, initial encounter: Secondary | ICD-10-CM | POA: Diagnosis not present

## 2018-03-12 DIAGNOSIS — F10239 Alcohol dependence with withdrawal, unspecified: Secondary | ICD-10-CM | POA: Diagnosis present

## 2018-03-12 DIAGNOSIS — S60811A Abrasion of right wrist, initial encounter: Secondary | ICD-10-CM | POA: Diagnosis present

## 2018-03-12 DIAGNOSIS — J690 Pneumonitis due to inhalation of food and vomit: Secondary | ICD-10-CM | POA: Diagnosis present

## 2018-03-12 DIAGNOSIS — F10220 Alcohol dependence with intoxication, uncomplicated: Principal | ICD-10-CM | POA: Diagnosis present

## 2018-03-12 DIAGNOSIS — S50312A Abrasion of left elbow, initial encounter: Secondary | ICD-10-CM | POA: Diagnosis present

## 2018-03-12 DIAGNOSIS — S0101XA Laceration without foreign body of scalp, initial encounter: Secondary | ICD-10-CM | POA: Diagnosis present

## 2018-03-12 DIAGNOSIS — T1490XA Injury, unspecified, initial encounter: Secondary | ICD-10-CM | POA: Diagnosis present

## 2018-03-12 DIAGNOSIS — S80211A Abrasion, right knee, initial encounter: Secondary | ICD-10-CM | POA: Diagnosis present

## 2018-03-12 DIAGNOSIS — R402362 Coma scale, best motor response, obeys commands, at arrival to emergency department: Secondary | ICD-10-CM | POA: Diagnosis present

## 2018-03-12 DIAGNOSIS — F10920 Alcohol use, unspecified with intoxication, uncomplicated: Secondary | ICD-10-CM

## 2018-03-12 DIAGNOSIS — Y92524 Gas station as the place of occurrence of the external cause: Secondary | ICD-10-CM

## 2018-03-12 LAB — URINALYSIS, ROUTINE W REFLEX MICROSCOPIC
Bacteria, UA: NONE SEEN
Bilirubin Urine: NEGATIVE
GLUCOSE, UA: NEGATIVE mg/dL
Ketones, ur: NEGATIVE mg/dL
Leukocytes, UA: NEGATIVE
Nitrite: NEGATIVE
PH: 6 (ref 5.0–8.0)
Protein, ur: NEGATIVE mg/dL
SPECIFIC GRAVITY, URINE: 1.002 — AB (ref 1.005–1.030)

## 2018-03-12 LAB — COMPREHENSIVE METABOLIC PANEL
ALT: 22 U/L (ref 0–44)
AST: 29 U/L (ref 15–41)
Albumin: 4.3 g/dL (ref 3.5–5.0)
Alkaline Phosphatase: 42 U/L (ref 38–126)
Anion gap: 18 — ABNORMAL HIGH (ref 5–15)
BUN: 6 mg/dL (ref 6–20)
CO2: 23 mmol/L (ref 22–32)
Calcium: 8.8 mg/dL — ABNORMAL LOW (ref 8.9–10.3)
Chloride: 101 mmol/L (ref 98–111)
Creatinine, Ser: 0.85 mg/dL (ref 0.61–1.24)
GFR calc Af Amer: >60 mL/min (ref >60)
GFR calc non Af Amer: >60 mL/min (ref >60)
Glucose, Bld: 128 mg/dL — ABNORMAL HIGH (ref 70–99)
Potassium: 3.6 mmol/L (ref 3.5–5.1)
Sodium: 142 mmol/L (ref 135–145)
Total Bilirubin: 0.8 mg/dL (ref 0.3–1.2)
Total Protein: 7.1 g/dL (ref 6.5–8.1)

## 2018-03-12 LAB — CBC
HCT: 42.8 % (ref 39.0–52.0)
Hemoglobin: 14.2 g/dL (ref 13.0–17.0)
MCH: 30.3 pg (ref 26.0–34.0)
MCHC: 33.2 g/dL (ref 30.0–36.0)
MCV: 91.5 fL (ref 80.0–100.0)
Platelets: 253 10*3/uL (ref 150–400)
RBC: 4.68 MIL/uL (ref 4.22–5.81)
RDW: 12.4 % (ref 11.5–15.5)
WBC: 13 10*3/uL — ABNORMAL HIGH (ref 4.0–10.5)
nRBC: 0 % (ref 0.0–0.2)

## 2018-03-12 LAB — RAPID URINE DRUG SCREEN, HOSP PERFORMED
Amphetamines: NOT DETECTED
Barbiturates: NOT DETECTED
Benzodiazepines: POSITIVE — AB
COCAINE: NOT DETECTED
Opiates: NOT DETECTED
Tetrahydrocannabinol: NOT DETECTED

## 2018-03-12 LAB — LACTIC ACID, PLASMA
Lactic Acid, Venous: 5 mmol/L (ref 0.5–1.9)
Lactic Acid, Venous: 5.3 mmol/L (ref 0.5–1.9)
Lactic Acid, Venous: 3.9 mmol/L (ref 0.5–1.9)

## 2018-03-12 LAB — SAMPLE TO BLOOD BANK

## 2018-03-12 LAB — CDS SEROLOGY

## 2018-03-12 LAB — PROTIME-INR
INR: 1
Prothrombin Time: 13.1 seconds (ref 11.4–15.2)

## 2018-03-12 LAB — ETHANOL: Alcohol, Ethyl (B): 322 mg/dL (ref <10)

## 2018-03-12 MED ORDER — TETANUS-DIPHTH-ACELL PERTUSSIS 5-2.5-18.5 LF-MCG/0.5 IM SUSP
0.5000 mL | Freq: Once | INTRAMUSCULAR | Status: AC
  Administered 2018-03-12: 0.5 mL via INTRAMUSCULAR
  Filled 2018-03-12: qty 0.5

## 2018-03-12 MED ORDER — ONDANSETRON HCL 4 MG/2ML IJ SOLN
4.0000 mg | Freq: Four times a day (QID) | INTRAMUSCULAR | Status: DC | PRN
  Administered 2018-03-12: 4 mg via INTRAVENOUS
  Filled 2018-03-12: qty 2

## 2018-03-12 MED ORDER — LORAZEPAM 2 MG/ML IJ SOLN
0.0000 mg | Freq: Four times a day (QID) | INTRAMUSCULAR | Status: DC
  Administered 2018-03-12 – 2018-03-13 (×5): 2 mg via INTRAVENOUS
  Filled 2018-03-12 (×5): qty 1

## 2018-03-12 MED ORDER — LORAZEPAM 2 MG/ML IJ SOLN
1.0000 mg | Freq: Four times a day (QID) | INTRAMUSCULAR | Status: DC | PRN
  Administered 2018-03-12 – 2018-03-13 (×2): 1 mg via INTRAVENOUS
  Filled 2018-03-12 (×2): qty 1

## 2018-03-12 MED ORDER — SODIUM CHLORIDE 0.9 % IV BOLUS
1000.0000 mL | Freq: Once | INTRAVENOUS | Status: AC
  Administered 2018-03-12: 1000 mL via INTRAVENOUS

## 2018-03-12 MED ORDER — ACETAMINOPHEN 650 MG RE SUPP: 650.0000 mg | Freq: Four times a day (QID) | RECTAL | Status: DC | PRN

## 2018-03-12 MED ORDER — FOLIC ACID 1 MG PO TABS
1.0000 mg | ORAL_TABLET | Freq: Every day | ORAL | Status: DC
  Administered 2018-03-12 – 2018-03-14 (×3): 1 mg via ORAL
  Filled 2018-03-12 (×5): qty 1

## 2018-03-12 MED ORDER — VITAMIN B-1 100 MG PO TABS
100.0000 mg | ORAL_TABLET | Freq: Every day | ORAL | Status: DC
  Administered 2018-03-14: 100 mg via ORAL
  Filled 2018-03-12 (×4): qty 1

## 2018-03-12 MED ORDER — SODIUM CHLORIDE 0.9 % IV SOLN
3.0000 g | Freq: Four times a day (QID) | INTRAVENOUS | Status: DC
  Administered 2018-03-12 – 2018-03-13 (×4): 3 g via INTRAVENOUS
  Filled 2018-03-12 (×7): qty 3

## 2018-03-12 MED ORDER — LORAZEPAM 1 MG PO TABS
1.0000 mg | ORAL_TABLET | Freq: Four times a day (QID) | ORAL | Status: DC | PRN
  Administered 2018-03-12 – 2018-03-13 (×2): 1 mg via ORAL
  Filled 2018-03-12 (×2): qty 1

## 2018-03-12 MED ORDER — IOHEXOL 300 MG/ML  SOLN
100.0000 mL | Freq: Once | INTRAMUSCULAR | Status: AC | PRN
  Administered 2018-03-12: 100 mL via INTRAVENOUS

## 2018-03-12 MED ORDER — THIAMINE HCL 100 MG/ML IJ SOLN
100.0000 mg | Freq: Every day | INTRAMUSCULAR | Status: DC
  Administered 2018-03-12 – 2018-03-13 (×2): 100 mg via INTRAVENOUS
  Filled 2018-03-12 (×2): qty 2

## 2018-03-12 MED ORDER — HALOPERIDOL LACTATE 5 MG/ML IJ SOLN
INTRAMUSCULAR | Status: AC | PRN
  Administered 2018-03-12: 5 mg

## 2018-03-12 MED ORDER — LORAZEPAM 2 MG/ML IJ SOLN
2.0000 mg | Freq: Once | INTRAMUSCULAR | Status: AC
  Administered 2018-03-12: 2 mg via INTRAVENOUS
  Filled 2018-03-12: qty 1

## 2018-03-12 MED ORDER — ONDANSETRON HCL 4 MG PO TABS
4.0000 mg | ORAL_TABLET | Freq: Four times a day (QID) | ORAL | Status: DC | PRN
  Administered 2018-03-13: 4 mg via ORAL
  Filled 2018-03-12: qty 1

## 2018-03-12 MED ORDER — SODIUM CHLORIDE 0.9 % IV SOLN
INTRAVENOUS | Status: DC
  Administered 2018-03-12 – 2018-03-13 (×2): via INTRAVENOUS

## 2018-03-12 MED ORDER — ACETAMINOPHEN 325 MG PO TABS
650.0000 mg | ORAL_TABLET | Freq: Four times a day (QID) | ORAL | Status: DC | PRN
  Administered 2018-03-12 – 2018-03-13 (×2): 650 mg via ORAL
  Filled 2018-03-12 (×2): qty 2

## 2018-03-12 MED ORDER — ADULT MULTIVITAMIN W/MINERALS CH
1.0000 | ORAL_TABLET | Freq: Every day | ORAL | Status: DC
  Administered 2018-03-12 – 2018-03-14 (×3): 1 via ORAL
  Filled 2018-03-12 (×5): qty 1

## 2018-03-12 MED ORDER — HALOPERIDOL LACTATE 5 MG/ML IJ SOLN
INTRAMUSCULAR | Status: AC
  Filled 2018-03-12: qty 1

## 2018-03-12 MED ORDER — LORAZEPAM 2 MG/ML IJ SOLN: 0.0000 mg | Freq: Two times a day (BID) | INTRAMUSCULAR | Status: DC

## 2018-03-12 NOTE — Progress Notes (Signed)
Pt stated he had chest pain upon assessment. Vitals all within normal range and pt is now resting. Doctor notified

## 2018-03-12 NOTE — Progress Notes (Signed)
Jeffrey Mosley. is a 43 y.o. male patient admitted from ED awake, alert - oriented  X 4 - no acute distress noted.  IV in place, occlusive dsg intact without redness.  Orientation to room, and floor completed with information packet given to patient/family.  Patient declined safety video at this time.  Admission INP armband ID verified with patient/family, and in place.   SR up x 2, fall assessment complete, with patient and family able to verbalize understanding of risk associated with falls, and verbalized understanding to call nsg before up out of bed.  Call light within reach, patient able to voice, and demonstrate understanding.  Skin, clean-dry- intact without evidence of bruising, or skin tears.   No evidence of skin break down noted on exam.     Will cont to eval and treat per MD orders.  Jeralene Peters, RN 03/12/2018 3:04 PM
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
## 2018-03-12 NOTE — ED Provider Notes (Signed)
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 normal limits. 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 Upper chest: Unremarkable. Other: None. IMPRESSION: CT HEAD: 1. No acute intracranial abnormality. 2. Multifocal scalp lacerations with skin staples in place. Calvarium intact. CT MAXILLOFACIAL: 1. Small left periorbital soft tissue contusion. 2. No other acute maxillofacial injury.  No fracture. CT CERVICAL SPINE: 1. No acute traumatic injury within the cervical spine. 2. 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 Electronically Signed   By: Rise Mu M.D.   On: 03/12/2018 04:17    Procedures .Marland KitchenLaceration Repair Date/Time: 03/12/2018 3:10 AM Performed by: Glynn Octave, MD Authorized by: Glynn Octave, MD   Consent:    Consent obtained:  Emergent situation   Consent given by:  Patient   Risks discussed:  Infection, need for additional repair, poor wound healing, poor cosmetic result, pain, nerve damage, retained foreign body, tendon damage and vascular damage   Alternatives discussed:  No  treatment Anesthesia (see MAR for exact dosages):    Anesthesia method:  Local infiltration   Local anesthetic:  Lidocaine 1% WITH epi Laceration details:    Location:  Scalp   Scalp location:  Frontal   Length (cm):  5 Repair type:    Repair type:  Complex Pre-procedure details:    Preparation:  Patient was prepped and draped in usual sterile fashion and imaging obtained to evaluate for foreign bodies Exploration:    Hemostasis achieved with:  Tied off vessels, epinephrine, direct pressure and cautery   Wound exploration: wound explored through full range of motion and entire depth of wound probed and visualized     Wound extent: vascular damage   Treatment:    Area cleansed with:  Betadine   Amount of cleaning:  Standard   Irrigation solution:  Sterile saline   Irrigation method:  Pressure wash   Visualized foreign bodies/material removed: no     Debridement:  Minimal Subcutaneous repair:    Suture size:  3-0   Suture material:  Vicryl   Suture technique:  Figure eight   Number of sutures:  3 Skin repair:    Repair method:  Staples   Number of staples:  10 Approximation:    Approximation:  Close Post-procedure details:    Dressing:  Antibiotic ointment   Patient tolerance of procedure:  Tolerated well, no immediate complications   (including critical care time)  Medications Ordered in ED Medications  haloperidol lactate (HALDOL) injection (5 mg  Given 03/12/18 0241)  haloperidol lactate (HALDOL) 5 MG/ML injection (has no administration in time range)  sodium chloride 0.9 % bolus 1,000 mL (has no administration in time range)    And  0.9 %  sodium chloride infusion (has no administration  in time range)  Tdap (BOOSTRIX) injection 0.5 mL (has no administration in time range)     Initial Impression / Assessment and Plan / ED Course  I [facility] reviewed the triage vital signs and the nursing notes.  Pertinent labs & imaging results that were available during my care of the  patient were reviewed by me and considered in my medical decision making (see chart for details).    Patient presents intoxicated after some type of trauma.  He cannot give any history.  He has a large laceration with arterial bleeding to his head as well as multiple contusions to his knees and elbows and wrists.  EMS [facility] him [facility].  He was seen in the ED last evening for alcohol intoxication and discharged.  GCS is 14.  ABCs are intact. Patient was actively bleeding arterial scalp laceration was repaired on arrival.  He did require IV Haldol for compliance to help with his sedation work-up.  Traumatic imaging is obtained with negative head CT and C-spine CT. Imaging is negative for acute traumatic injury.  Tetanus updated and wounds cleaned.  Patient will need further monitoring to await sobriety.  Patient becoming more awake, agitated. Tachycardic 120s.  IV ativan given.  Concern for beginnings of alcohol withdrawal.   Repeat lactate is 2 liters of fluid. Still remains 5.  Will start unasyn for possible aspiration.  Blood cultures obtained.  Admission d/w Dr. Jerral Ralph.   CRITICAL CARE Performed by: Glynn Octave Total critical care time: 60 minutes Critical care time was exclusive of separately billable procedures and treating other patients. Critical care was necessary to treat or prevent imminent or life-threatening deterioration. Critical care was time spent personally by me on the following activities: development of treatment plan with patient and/or surrogate as well as nursing, discussions with consultants, evaluation of patient's response to treatment, examination of patient, obtaining history from patient or surrogate, ordering and performing treatments and interventions, ordering and review of laboratory studies, ordering and review of radiographic studies, pulse oximetry and re-evaluation of patient's condition.    Final Clinical Impressions(s) / ED  Diagnoses   Final diagnoses:  Injury of head, initial encounter  Fall, initial encounter  Alcoholic intoxication without complication (HCC)  Lactic acidosis    ED Discharge Orders    None       Luv Mish, Jeannett Senior, MD 03/12/18 [phone]

## 2018-03-12 NOTE — Progress Notes (Signed)
Pharmacy Antibiotic Note  Jeffrey JewsMichael A Aldape Jr. is a 43 y.o. male admitted on 03/12/2018 with aspiration pna.  Pharmacy has been consulted for Unasyn dosing.  Plan: Unasyn 3gm IV q6h Will f/u renal function, micro data, and pt's clinical condition  Height: 5\' 7"  (170.2 cm) Weight: 200 lb (90.7 kg) IBW/kg (Calculated) : 66.1  Temp (24hrs), Avg:98.9 F (37.2 C), Min:98.7 F (37.1 C), Max:99.1 F (37.3 C)  Recent Labs  Lab 03/11/18 1204 03/12/18 0302 03/12/18 0504  WBC 7.5 13.0*  --   CREATININE 0.91 0.85  --   LATICACIDVEN  --  5.0* 5.3*    Estimated Creatinine Clearance: 119.1 mL/min (by C-G formula based on SCr of 0.85 mg/dL).    Not on File  Antimicrobials this admission: 2/6 Unasyn >>   Microbiology results: Pending  Thank you for allowing pharmacy to be a part of this patient's care.  Jeffrey [facility], Jeffrey [facility] 03/12/2018 6:57 AM

## 2018-03-12 NOTE — ED Notes (Signed)
Pt transported on cardiac monitor by tech to [facility] via wheelchair.

## 2018-03-13 DIAGNOSIS — S0990XA Unspecified injury of head, initial encounter: Secondary | ICD-10-CM

## 2018-03-13 DIAGNOSIS — F10230 Alcohol dependence with withdrawal, uncomplicated: Secondary | ICD-10-CM

## 2018-03-13 DIAGNOSIS — W19XXXA Unspecified fall, initial encounter: Secondary | ICD-10-CM

## 2018-03-13 LAB — BASIC METABOLIC PANEL
Anion gap: 13 (ref 5–15)
BUN: 6 mg/dL (ref 6–20)
CO2: 26 mmol/L (ref 22–32)
Calcium: 9 mg/dL (ref 8.9–10.3)
Chloride: 97 mmol/L — ABNORMAL LOW (ref 98–111)
Creatinine, Ser: 0.81 mg/dL (ref 0.61–1.24)
GFR calc Af Amer: >60 mL/min (ref >60)
GFR calc non Af Amer: >60 mL/min (ref >60)
Glucose, Bld: 114 mg/dL — ABNORMAL HIGH (ref 70–99)
Potassium: 3.4 mmol/L — ABNORMAL LOW (ref 3.5–5.1)
Sodium: 136 mmol/L (ref 135–145)

## 2018-03-13 LAB — CBC
HCT: 38.6 % — ABNORMAL LOW (ref 39.0–52.0)
Hemoglobin: 12.5 g/dL — ABNORMAL LOW (ref 13.0–17.0)
MCH: 29.3 pg (ref 26.0–34.0)
MCHC: 32.4 g/dL (ref 30.0–36.0)
MCV: 90.6 fL (ref 80.0–100.0)
Platelets: 210 10*3/uL (ref 150–400)
RBC: 4.26 MIL/uL (ref 4.22–5.81)
RDW: 12.1 % (ref 11.5–15.5)
WBC: 7.1 10*3/uL (ref 4.0–10.5)
nRBC: 0 % (ref 0.0–0.2)

## 2018-03-13 LAB — PHOSPHORUS: Phosphorus: 2.9 mg/dL (ref 2.5–4.6)

## 2018-03-13 LAB — HIV ANTIBODY (ROUTINE TESTING W REFLEX): HIV Screen 4th Generation wRfx: NONREACTIVE

## 2018-03-13 LAB — MAGNESIUM: Magnesium: 2.3 mg/dL (ref 1.7–2.4)

## 2018-03-13 MED ORDER — CHLORDIAZEPOXIDE HCL 25 MG PO CAPS
25.0000 mg | ORAL_CAPSULE | Freq: Three times a day (TID) | ORAL | Status: DC
  Administered 2018-03-13 (×3): 25 mg via ORAL
  Filled 2018-03-13 (×3): qty 1

## 2018-03-13 MED ORDER — ENOXAPARIN SODIUM 40 MG/0.4ML ~~LOC~~ SOLN
40.0000 mg | SUBCUTANEOUS | Status: DC
  Administered 2018-03-13: 40 mg via SUBCUTANEOUS
  Filled 2018-03-13 (×2): qty 0.4

## 2018-03-13 MED ORDER — LORAZEPAM 2 MG/ML IJ SOLN
2.0000 mg | INTRAMUSCULAR | Status: DC | PRN
  Administered 2018-03-13 – 2018-03-14 (×8): 2 mg via INTRAVENOUS
  Administered 2018-03-14: 3 mg via INTRAVENOUS
  Administered 2018-03-14 (×2): 2 mg via INTRAVENOUS
  Administered 2018-03-15: 3 mg via INTRAVENOUS
  Administered 2018-03-15: 2 mg via INTRAVENOUS
  Filled 2018-03-13 (×5): qty 1
  Filled 2018-03-13: qty 2
  Filled 2018-03-13 (×4): qty 1
  Filled 2018-03-13: qty 2
  Filled 2018-03-13: qty 1
  Filled 2018-03-13: qty 2

## 2018-03-13 NOTE — Progress Notes (Signed)
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 Other: No significant mastoid effusion. CT MAXILLOFACIAL FINDINGS Osseous: Zygomatic arches intact. No acute maxillary fracture. Pterygoid plates intact. Nasal bones intact. Nasal septum midline and intact. No acute mandibular fracture. Mandibular condyles normally situated. No acute abnormality about the dentition. Orbits: Globes and orbital soft tissues within normal limits. Bony orbits intact. Sinuses: Mild circumferential mucosal thickening within the left maxillary sinus. Paranasal sinuses are otherwise clear. Soft tissues: Small left periorbital contusion. No other appreciable soft tissue injury. CT CERVICAL SPINE FINDINGS Alignment: Mild straightening of the normal cervical lordosis. No listhesis or malalignment. Skull base and vertebrae: Skull base intact. Normal C1-2 articulations are preserved in the dens is intact. Vertebral body heights maintained. No acute fracture. Soft tissues and spinal canal: Visible soft tissues of the neck demonstrate  no acute finding. No abnormal prevertebral edema. Spinal canal within normal limits. 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 Upper chest: Unremarkable. Other: None. IMPRESSION: CT HEAD: 1. No acute intracranial abnormality. 2. Multifocal scalp lacerations with skin staples in place. Calvarium intact. CT MAXILLOFACIAL: 1. Small left periorbital soft tissue contusion. 2. No other acute maxillofacial injury.  No fracture. CT CERVICAL SPINE: 1. No acute traumatic injury within the cervical spine. 2. 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 Electronically Signed   By: Rise Mu M.D.   On: 03/12/2018 04:17     LOS: 1 day   Jeoffrey Massed, MD  [facility]  If 7PM-7AM, please contact night-coverage  Please page via [email or web address]  Go to [email or web address] and use [facility]'s universal password to access. If you do not have the password, please contact the hospital operator.  Locate the [facility] provider you are looking for under [facility] and page to a number that you can be directly reached. If you still have difficulty reaching the provider, please page the [facility] (Director on Call) for the Hospitalists listed on amion for assistance.  03/13/2018, 11:39 AM
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
## 2018-03-13 NOTE — Progress Notes (Signed)
MD Bodenheimer paged at [phone]. Notified of patient BP of 140/97 and prior CIWA score of 20 at 0318 and 12 at 0150. PRN 1 mg PO Ativan given. Will continue to monitor.
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
## 2018-03-14 LAB — BASIC METABOLIC PANEL
Anion gap: 10 (ref 5–15)
BUN: 5 mg/dL — AB (ref 6–20)
CO2: 26 mmol/L (ref 22–32)
Calcium: 8.5 mg/dL — ABNORMAL LOW (ref 8.9–10.3)
Chloride: 103 mmol/L (ref 98–111)
Creatinine, Ser: 0.87 mg/dL (ref 0.61–1.24)
GFR calc Af Amer: >60 mL/min (ref >60)
GFR calc non Af Amer: >60 mL/min (ref >60)
Glucose, Bld: 148 mg/dL — ABNORMAL HIGH (ref 70–99)
Potassium: 3.3 mmol/L — ABNORMAL LOW (ref 3.5–5.1)
Sodium: 139 mmol/L (ref 135–145)

## 2018-03-14 LAB — MAGNESIUM: Magnesium: 2.2 mg/dL (ref 1.7–2.4)

## 2018-03-14 MED ORDER — CHLORDIAZEPOXIDE HCL 25 MG PO CAPS
50.0000 mg | ORAL_CAPSULE | Freq: Three times a day (TID) | ORAL | Status: DC
  Administered 2018-03-14: 50 mg via ORAL
  Filled 2018-03-14 (×2): qty 2

## 2018-03-14 MED ORDER — POTASSIUM CHLORIDE CRYS ER 20 MEQ PO TBCR
40.0000 meq | EXTENDED_RELEASE_TABLET | ORAL | Status: AC
  Administered 2018-03-14 (×2): 40 meq via ORAL
  Filled 2018-03-14 (×2): qty 2
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
## 2018-03-14 NOTE — Progress Notes (Signed)
CSW met with patient. Sitter was in the room. CSW provided substance abuse resources. Patient is interested in going to an intensive inpatient treatment program. Patient had questions about which program was the best, CSW stated that she could not make a recommendation. Patient was disgruntled and stated that this could be a life decision choice. CSW encouraged the patient to do his own research and call the facilities that he feels that would best meet to his facilities. Patient is currently homeless and wants to find a residential program for at least 90 days.   CSW stated that there was another CSW that also has his LCAS. CSW stated that she would refer the patient to him if he was able to come by and speak with him.   CSW will follow up.    , MSW, LCSW-A Clinical Social Worker [place] [employer]  

## 2018-03-14 NOTE — Progress Notes (Signed)
CSW shared with CSW Gabriel Rung that the patient was interested in speaking with him since CSW Gabriel Rung has his LCAS.   CSW Gabriel Rung would go speak with the patient and answer any substance abuse treatment questions.   CSW Evadean Sproule signing off.   Drucilla Schmidt, MSW, LCSW-A Clinical Social Worker [employer]
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
## 2018-03-14 NOTE — Progress Notes (Signed)
CSW met with patient to discuss ETOH use. CSW processed with patient his alcohol use beginning at 43 y/o to graduating to 1-2 handles of liquor daily. CSW processed patient's motivations to abstain from ETOH use and discussed his relationship with his daughters and current state of homelessness. CSW spoke with patient at length regarding motivations and desire for sobriety.  CSW worked with patient to develop relapse prevention plan. CSW noted patient was receptive to an [employer] as he has been in the past. CSW noted patient's desire to follow up with AA. CSW processed with patient him stating his triggers for relapse and discussed relapse preventions skills/supports.  Lamonte Richer, LCSW, [place] Worker [facility] [phone]

## 2018-03-14 NOTE — Progress Notes (Signed)
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 vessel. Skull: Multifocal scalp lacerations with skin staples in place at the left forehead and right occipital region. Calvarium intact. Other: No significant mastoid effusion. CT MAXILLOFACIAL FINDINGS Osseous: Zygomatic arches intact. No acute maxillary fracture. Pterygoid plates intact. Nasal bones intact. Nasal septum midline and intact. No acute mandibular fracture. Mandibular condyles normally situated. No acute abnormality about the dentition. Orbits: Globes and orbital soft tissues within normal limits. Bony orbits intact. Sinuses: Mild circumferential mucosal thickening within the left maxillary sinus. Paranasal sinuses are otherwise clear. Soft tissues: Small left periorbital contusion. No other appreciable soft tissue injury. CT CERVICAL SPINE FINDINGS Alignment: Mild straightening of the normal cervical lordosis. No listhesis or malalignment. Skull base and vertebrae: Skull base intact. Normal C1-2 articulations are preserved in the  dens is intact. Vertebral body heights maintained. No acute fracture. Soft tissues and spinal canal: Visible soft tissues of the neck demonstrate no acute finding. No abnormal prevertebral edema. Spinal canal within normal limits. 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 Upper chest: Unremarkable. Other: None. IMPRESSION: CT HEAD: 1. No acute intracranial abnormality. 2. Multifocal scalp lacerations with skin staples in place. Calvarium intact. CT MAXILLOFACIAL: 1. Small left periorbital soft tissue contusion. 2. No other acute maxillofacial injury.  No fracture. CT CERVICAL SPINE: 1. No acute traumatic injury within the cervical spine. 2. 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 Electronically Signed   By: Rise MuBenjamin  McClintock M.D.   On: 03/12/2018 04:17     LOS: 2 days   Jeoffrey MassedShanker Melaine Mcphee, MD  [facility]  If 7PM-7AM, please contact night-coverage  Please page via [email or web address]  Go to [email or web address] and use [facility] universal password to access. If you do not have the password, please contact the hospital operator.  Locate the [facility] provider you are looking for under [facility] and page to a number that you can be directly reached. If you still have difficulty reaching the provider, please page the [facility] (Director on Call) for the Hospitalists listed on amion for assistance.  03/14/2018, 10:46 AM

## 2018-03-15 LAB — MAGNESIUM: Magnesium: 2.2 mg/dL (ref 1.7–2.4)

## 2018-03-15 LAB — BASIC METABOLIC PANEL
Anion gap: 13 (ref 5–15)
BUN: 8 mg/dL (ref 6–20)
CO2: 23 mmol/L (ref 22–32)
Calcium: 9.5 mg/dL (ref 8.9–10.3)
Chloride: 99 mmol/L (ref 98–111)
Creatinine, Ser: 0.88 mg/dL (ref 0.61–1.24)
GFR calc Af Amer: >60 mL/min (ref >60)
GFR calc non Af Amer: >60 mL/min (ref >60)
Glucose, Bld: 121 mg/dL — ABNORMAL HIGH (ref 70–99)
POTASSIUM: 3.9 mmol/L (ref 3.5–5.1)
Sodium: 135 mmol/L (ref 135–145)

## 2018-03-15 LAB — PHOSPHORUS: Phosphorus: 4.2 mg/dL (ref 2.5–4.6)

## 2018-03-15 MED ORDER — LORAZEPAM 2 MG/ML IJ SOLN
4.0000 mg | Freq: Once | INTRAMUSCULAR | Status: AC | PRN
  Administered 2018-03-15: 4 mg via INTRAVENOUS
  Filled 2018-03-15: qty 2

## 2018-03-15 MED ORDER — DIAZEPAM 5 MG/ML IJ SOLN
10.0000 mg | Freq: Once | INTRAMUSCULAR | Status: AC
  Administered 2018-03-15: 10 mg via INTRAVENOUS
  Filled 2018-03-15: qty 2

## 2018-03-15 MED ORDER — LORAZEPAM 2 MG/ML IJ SOLN
4.0000 mg | Freq: Once | INTRAMUSCULAR | Status: AC
  Administered 2018-03-15: 4 mg via INTRAVENOUS
  Filled 2018-03-15: qty 2

## 2018-03-15 MED ORDER — POTASSIUM CHLORIDE CRYS ER 20 MEQ PO TBCR
40.0000 meq | EXTENDED_RELEASE_TABLET | Freq: Once | ORAL | Status: DC
  Filled 2018-03-15: qty 2

## 2018-03-15 NOTE — Progress Notes (Signed)
Pt received 4mg  Ativan once per MD order. Will continue to monitor.

## 2018-03-15 NOTE — Progress Notes (Signed)
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 structures were also generated. COMPARISON:  None. FINDINGS: CT HEAD FINDINGS Brain: Cerebral volume within normal limits. No acute intracranial hemorrhage. No acute large vessel territory infarct. No mass lesion, midline shift or mass effect. No hydrocephalus. No appreciable extra-axial fluid collection. Vascular: No hyperdense vessel. Skull: Multifocal scalp lacerations with skin staples in place at the left forehead and right occipital region. Calvarium intact. Other: No significant mastoid effusion. CT MAXILLOFACIAL FINDINGS Osseous: Zygomatic arches intact. No acute maxillary fracture. Pterygoid plates intact. Nasal bones intact. Nasal septum midline and intact. No acute mandibular fracture. Mandibular condyles normally situated. No acute abnormality about the dentition. Orbits: Globes and orbital soft tissues within normal limits. Bony orbits intact. Sinuses: Mild circumferential mucosal thickening within the left maxillary sinus. Paranasal  sinuses are otherwise clear. Soft tissues: Small left periorbital contusion. No other appreciable soft tissue injury. CT CERVICAL SPINE FINDINGS Alignment: Mild straightening of the normal cervical lordosis. No listhesis or malalignment. Skull base and vertebrae: Skull base intact. Normal C1-2 articulations are preserved in the dens is intact. Vertebral body heights maintained. No acute fracture. Soft tissues and spinal canal: Visible soft tissues of the neck demonstrate no acute finding. No abnormal prevertebral edema. Spinal canal within normal limits. 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 Upper chest: Unremarkable. Other: None. IMPRESSION: CT HEAD: 1. No acute intracranial abnormality. 2. Multifocal scalp lacerations with skin staples in place. Calvarium intact. CT MAXILLOFACIAL: 1. Small left periorbital soft tissue contusion. 2. No other acute maxillofacial injury.  No fracture. CT CERVICAL SPINE: 1. No acute traumatic injury within the cervical spine. 2. 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 Electronically Signed   By: Rise MuBenjamin  McClintock M.D.   On: 03/12/2018 04:17     LOS: 3 days   Jeoffrey MassedShanker Ikram Riebe, MD  [facility]  If 7PM-7AM, please contact night-coverage  Please page via [email or web address]  Go to [email or web address] and use [place]'s universal password to access. If you do not have the password, please contact the hospital operator.  Locate the [facility] provider you are looking for under [facility] and page to a number that you can be directly reached. If you still have difficulty reaching the provider, please page the [facility] (Director on Call) for the Hospitalists listed on amion for assistance.  03/15/2018, 10:25 AM

## 2018-03-15 NOTE — Progress Notes (Signed)
Pt refused his librium and lovenox. Pt stated that "librium keeps him awake" and he that he "just does not want a shot". Pt educated.

## 2018-03-16 DIAGNOSIS — J690 Pneumonitis due to inhalation of food and vomit: Secondary | ICD-10-CM

## 2018-03-16 LAB — BASIC METABOLIC PANEL
Anion gap: 10 (ref 5–15)
BUN: 12 mg/dL (ref 6–20)
CALCIUM: 9.4 mg/dL (ref 8.9–10.3)
CO2: 24 mmol/L (ref 22–32)
Chloride: 104 mmol/L (ref 98–111)
Creatinine, Ser: 0.94 mg/dL (ref 0.61–1.24)
GFR calc Af Amer: >60 mL/min (ref >60)
GFR calc non Af Amer: >60 mL/min (ref >60)
Glucose, Bld: 99 mg/dL (ref 70–99)
Potassium: 3.9 mmol/L (ref 3.5–5.1)
Sodium: 138 mmol/L (ref 135–145)

## 2018-03-16 LAB — MAGNESIUM: Magnesium: 2.3 mg/dL (ref 1.7–2.4)

## 2018-03-16 MED ORDER — ADULT MULTIVITAMIN W/MINERALS CH: 1.0000 | ORAL_TABLET | Freq: Every day | ORAL | 0 refills | Status: AC

## 2018-03-16 MED ORDER — THIAMINE HCL 100 MG PO TABS: 100.0000 mg | ORAL_TABLET | Freq: Every day | ORAL | 0 refills | Status: AC

## 2018-03-16 NOTE — Progress Notes (Signed)
Pt. Requesting to see SW.  I asked pt. to go back to his room and I would have the SW come to see him.  Pt. stated he had checked out of that room.  When this RN spoke with pt. nurse Sam, she stated he has not been discharge yet. Went to speak with pt. doctor, Ghimire who stated he was discharging him now, but to call his mom. Also, spoke with the SW and informed her that pt. wanted to speak with her.  This RN called and spoke with Bonita Quin May, pt. mom who informed me to check with the SW to see if pt. needs to be discharged directly from [facility] to [facility] in [place] for insurance purposes.  Informed the SW of this and that family could provide transportation if needed.  Will continue to assist with discharge planning and await for pt. mom arrival.  Forbes Cellar, RN

## 2018-03-16 NOTE — Progress Notes (Signed)
Feels much better-he is all dressed up and wants to leave the hospital.  He is completely awake and alert, hardly any tremors this morning.  Did not required Ativan last night.  Social worker evaluation in progress-have spoken with social work to see if she can speak with patient and patient's family as well.  Stable for discharge later today.

## 2018-03-16 NOTE — Progress Notes (Signed)
Patient calm and cooperative. Patient ambulatory walking around unit.

## 2018-03-16 NOTE — Progress Notes (Signed)
Patient waiting for resources. Social work spoke to patient stating to wait until she hears back from facility. Patient IVs removed previously.  Patient left the unit without notifying staff.

## 2018-03-16 NOTE — Progress Notes (Signed)
CSW spoke with patient as requested. Patient reported that he is on the phone with his psychiatrist and requests CSW come back.   Osborne Casco Floyd Lusignan LCSW [phone]

## 2018-03-16 NOTE — Discharge Summary (Signed)
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 instructions/literature along with all the possible adverse reactions/side effects for all the Medicines you take and that have been prescribed to you.  Take any new Medicines after you have completely understood and accpet all the possible adverse reactions/side effects.   Do not drive when taking Pain medications or sleeping medications (Benzodaizepines)  Do not take more than prescribed Pain, Sleep and Anxiety Medications. It is not advisable to combine anxiety,sleep and pain medications without talking with your primary care practitioner  Special Instructions: If you have smoked or chewed Tobacco  in the last 2 yrs please stop smoking, stop any regular Alcohol  and or any Recreational drug use.  Wear Seat belts while driving.  Please note: You were cared for by a hospitalist during your hospital stay. Once you are discharged, your primary care physician will handle any further medical issues. Please note that NO REFILLS for any discharge medications will be authorized once you are discharged, as it is imperative that you return to your primary care physician (or establish a relationship with a primary care physician if you do not have one) for your post hospital discharge needs so that they can reassess your need for medications and monitor your lab values.  Total Time spent coordinating discharge including counseling, education and face to face time equals 35 minutes.  Signed: Jameal Razzano 03/16/2018 10:40 AM

## 2018-03-16 NOTE — Progress Notes (Signed)
CSW was notified by RN that patient has left. If CSW hears back from [facility], will pass on patient's contact info to the facility if patient is still interested in placement.   Osborne Casco Shaniquia Brafford LCSW [phone]

## 2018-03-16 NOTE — Progress Notes (Signed)
PT Cancellation Note  Patient Details Name: Jeffrey Mosley. MRN: [id number] DOB: 1975-03-06   Cancelled Treatment:    Reason Eval/Treat Not Completed: Other (comment)(Patient not in room when therapist attempted treatment. MD had completed Discharge Summary. ) Will attempt treatment again tomorrow if patient has not been discharge from hospital at that time.   Katina Dung. Hartnett-Rands, MS, PT [facility] [place] [phone] 03/16/2018, 2:24 PM

## 2018-03-16 NOTE — Progress Notes (Signed)
CSW spoke with patient again. He reported that his [facility] recommended [facility], which is in [place] [phone]). CSW contacted them and faxed referral for review (f. [phone]). Patient's things are packed. CSW asked him to wait until [facility] reports back to CSW.   Osborne Casco Pallavi Clifton LCSW [phone]

## 2018-03-17 LAB — CULTURE, BLOOD (ROUTINE X 2)
Culture: NO GROWTH
Culture: NO GROWTH
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
